# Patient Record
Sex: Male | Born: 1985 | Race: Black or African American | Hispanic: No | Marital: Single | Smoking: Never smoker
Health system: Southern US, Community
[De-identification: ages and names within clinical notes are randomized; demographics above are authoritative.]

## PROBLEM LIST (undated history)

## (undated) DIAGNOSIS — N200 Calculus of kidney: Secondary | ICD-10-CM

## (undated) HISTORY — PX: CHOLECYSTECTOMY: SHX55

## (undated) HISTORY — PX: APPENDECTOMY: SHX54

---

## 2013-02-12 ENCOUNTER — Emergency Department (HOSPITAL_BASED_OUTPATIENT_CLINIC_OR_DEPARTMENT_OTHER): Payer: Self-pay

## 2013-02-12 ENCOUNTER — Encounter (HOSPITAL_BASED_OUTPATIENT_CLINIC_OR_DEPARTMENT_OTHER): Payer: Self-pay | Admitting: Emergency Medicine

## 2013-02-12 ENCOUNTER — Emergency Department (HOSPITAL_BASED_OUTPATIENT_CLINIC_OR_DEPARTMENT_OTHER)
Admission: EM | Admit: 2013-02-12 | Discharge: 2013-02-12 | Disposition: A | Discharge status: Home / Self Care | Payer: Self-pay | Attending: Emergency Medicine | Admitting: Emergency Medicine

## 2013-02-12 DIAGNOSIS — R109 Unspecified abdominal pain: Secondary | ICD-10-CM | POA: Insufficient documentation

## 2013-02-12 DIAGNOSIS — Z87442 Personal history of urinary calculi: Secondary | ICD-10-CM | POA: Insufficient documentation

## 2013-02-12 DIAGNOSIS — R3129 Other microscopic hematuria: Secondary | ICD-10-CM | POA: Insufficient documentation

## 2013-02-12 HISTORY — DX: Calculus of kidney: N20.0

## 2013-02-12 LAB — URINALYSIS, ROUTINE W REFLEX MICROSCOPIC
Bilirubin Urine: NEGATIVE
GLUCOSE, UA: NEGATIVE mg/dL
Ketones, ur: NEGATIVE mg/dL
Leukocytes, UA: NEGATIVE
Nitrite: NEGATIVE
PH: 7 (ref 5.0–8.0)
Protein, ur: NEGATIVE mg/dL
SPECIFIC GRAVITY, URINE: 1.025 (ref 1.005–1.030)
Urobilinogen, UA: 1 mg/dL (ref 0.0–1.0)

## 2013-02-12 LAB — URINE MICROSCOPIC-ADD ON

## 2013-02-12 MED ORDER — ONDANSETRON HCL 4 MG/2ML IJ SOLN
4.0000 mg | Freq: Once | INTRAMUSCULAR | Status: AC
  Administered 2013-02-12: 4 mg via INTRAVENOUS
  Filled 2013-02-12: qty 2

## 2013-02-12 MED ORDER — HYDROMORPHONE HCL PF 1 MG/ML IJ SOLN
1.0000 mg | Freq: Once | INTRAMUSCULAR | Status: AC
  Administered 2013-02-12: 1 mg via INTRAVENOUS
  Filled 2013-02-12: qty 1

## 2013-02-12 MED ORDER — OXYCODONE-ACETAMINOPHEN 5-325 MG PO TABS: 2.0000 | ORAL_TABLET | ORAL | Status: AC | PRN

## 2013-02-12 NOTE — Discharge Instructions (Signed)
Percocet as prescribed as needed for pain.  Return to the emergency department if you develop worsening pain, blood in the stool or urine, or other new or concerning symptoms.   Flank Pain Flank pain refers to pain that is located on the side of the body between the upper abdomen and the back. The pain may occur over a short period of time (acute) or may be long-term or reoccurring (chronic). It may be mild or severe. Flank pain can be caused by many things. CAUSES  Some of the more common causes of flank pain include:  Muscle strains.   Muscle spasms.   A disease of your spine (vertebral disk disease).   A lung infection (pneumonia).   Fluid around your lungs (pulmonary edema).   A kidney infection.   Kidney stones.   A very painful skin rash caused by the chickenpox virus (shingles).   Gallbladder disease.  HOME CARE INSTRUCTIONS  Home care will depend on the cause of your pain. In general,  Rest as directed by your caregiver.  Drink enough fluids to keep your urine clear or pale yellow.  Only take over-the-counter or prescription medicines as directed by your caregiver. Some medicines may help relieve the pain.  Tell your caregiver about any changes in your pain.  Follow up with your caregiver as directed. SEEK IMMEDIATE MEDICAL CARE IF:   Your pain is not controlled with medicine.   You have new or worsening symptoms.  Your pain increases.   You have abdominal pain.   You have shortness of breath.   You have persistent nausea or vomiting.   You have swelling in your abdomen.   You feel faint or pass out.   You have blood in your urine.  You have a fever or persistent symptoms for more than 2 3 days.  You have a fever and your symptoms suddenly get worse. MAKE SURE YOU:   Understand these instructions.  Will watch your condition.  Will get help right away if you are not doing well or get worse. Document Released: 02/17/2005  Document Revised: 09/21/2011 Document Reviewed: 08/11/2011 [facility] Patient Information 2014 [employer], [place].

## 2013-02-12 NOTE — ED Notes (Signed)
Right flank pain onset yesterday afternoon continues today

## 2013-02-12 NOTE — ED Provider Notes (Signed)
CSN: [id number]     Arrival date & time 02/12/13  0902 History   First MD Initiated Contact with Patient 02/12/13 [phone]     Chief Complaint  Patient presents with  . right flank pain    (Consider location/radiation/quality/duration/timing/severity/associated sxs/prior Treatment) HPI Comments: Patient is a 28 year old male who presents with complaints of right flank pain which started yesterday afternoon. He denies any injury or trauma. He states that he is having some discomfort with urination but denies fevers or chills. There are no bowel complaints. He tells he had a kidney stone approximately 2 years ago which she was able to pass without intervention. This feels similar to that.  Patient is a 28 y.o. male presenting with flank pain. The history is provided by the patient.  Flank Pain This is a new problem. The current episode started yesterday. The problem occurs constantly. The problem has been rapidly worsening. Associated symptoms include abdominal pain. Nothing aggravates the symptoms. Nothing relieves the symptoms. He has tried nothing for the symptoms. The treatment provided no relief.    Past Medical History  Diagnosis Date  . Kidney stone    History reviewed. No pertinent past surgical history. History reviewed. No pertinent family history. History  Substance Use Topics  . Smoking status: Never Smoker   . Smokeless tobacco: Not on file  . Alcohol Use: No    Review of Systems  Gastrointestinal: Positive for abdominal pain.  Genitourinary: Positive for flank pain.  All other systems reviewed and are negative.    Allergies  Demerol; Morphine and related; and Toradol  Home Medications  No current outpatient prescriptions on file. BP 141/84  Pulse 75  Temp(Src) 97.6 F (36.4 C) (Oral)  Resp 15  Ht 6\' 3"  (1.905 m)  Wt 195 lb (88.451 kg)  BMI 24.37 kg/m2  SpO2 100% Physical Exam  Nursing note and vitals reviewed. Constitutional: He is oriented to person, place,  and time. He appears well-developed and well-nourished. No distress.  HENT:  Head: Normocephalic.  Mouth/Throat: Oropharynx is clear and moist.  Neck: Normal range of motion. Neck supple.  Cardiovascular: Normal rate, regular rhythm and normal heart sounds.   Pulmonary/Chest: Effort normal and breath sounds normal. No respiratory distress. He has no wheezes. He has no rales.  Abdominal: Soft. He exhibits no distension and no mass. There is no tenderness. There is no rebound and no guarding.  There is moderate right-sided CVA tenderness.  Musculoskeletal: Normal range of motion. He exhibits no edema.  Neurological: He is alert and oriented to person, place, and time.  Skin: Skin is warm and dry. He is not diaphoretic.    ED Course  Procedures (including critical care time) Labs Review Labs Reviewed  URINALYSIS, ROUTINE W REFLEX MICROSCOPIC - Abnormal; Notable for the following:    Hgb urine dipstick LARGE (*)    All other components within normal limits  URINE MICROSCOPIC-ADD ON - Abnormal; Notable for the following:    Bacteria, UA FEW (*)    All other components within normal limits   Imaging Review No results found.    MDM  No diagnosis found. Patient is a 28 year old male presents with flank pain. UA reveals microscopic hematuria, however CAT scan does not reveal an obstructing stone. He does have a small nonobstructing calculus within the renal parenchyma. It is possible that he had a stone and passed it. He is feeling better with the medication in the ER. There is no other acute pathology noted on the CAT  scan he appears very stable for discharge. He will be given a small amount of pain medication in case his discomfort is residual from a passed stone. If he worsens he is to return for reevaluation.    Geoffery Lyonsouglas Oren Barella, MD 02/12/13 1100

## 2013-03-06 ENCOUNTER — Ambulatory Visit (HOSPITAL_BASED_OUTPATIENT_CLINIC_OR_DEPARTMENT_OTHER): Payer: Self-pay

## 2013-03-06 ENCOUNTER — Encounter (HOSPITAL_BASED_OUTPATIENT_CLINIC_OR_DEPARTMENT_OTHER): Payer: Self-pay | Admitting: Emergency Medicine

## 2013-03-06 ENCOUNTER — Emergency Department (HOSPITAL_BASED_OUTPATIENT_CLINIC_OR_DEPARTMENT_OTHER)
Admission: EM | Admit: 2013-03-06 | Discharge: 2013-03-06 | Disposition: A | Discharge status: Home / Self Care | Payer: Self-pay | Attending: Emergency Medicine | Admitting: Emergency Medicine

## 2013-03-06 DIAGNOSIS — Z87442 Personal history of urinary calculi: Secondary | ICD-10-CM | POA: Insufficient documentation

## 2013-03-06 DIAGNOSIS — W219XXA Striking against or struck by unspecified sports equipment, initial encounter: Secondary | ICD-10-CM | POA: Insufficient documentation

## 2013-03-06 DIAGNOSIS — S3994XA Unspecified injury of external genitals, initial encounter: Secondary | ICD-10-CM | POA: Insufficient documentation

## 2013-03-06 DIAGNOSIS — S39848A Other specified injuries of external genitals, initial encounter: Secondary | ICD-10-CM | POA: Insufficient documentation

## 2013-03-06 DIAGNOSIS — N50819 Testicular pain, unspecified: Secondary | ICD-10-CM

## 2013-03-06 DIAGNOSIS — Y9367 Activity, basketball: Secondary | ICD-10-CM | POA: Insufficient documentation

## 2013-03-06 DIAGNOSIS — Y92838 Other recreation area as the place of occurrence of the external cause: Secondary | ICD-10-CM

## 2013-03-06 DIAGNOSIS — Y9239 Other specified sports and athletic area as the place of occurrence of the external cause: Secondary | ICD-10-CM | POA: Insufficient documentation

## 2013-03-06 LAB — URINALYSIS, ROUTINE W REFLEX MICROSCOPIC
BILIRUBIN URINE: NEGATIVE
Glucose, UA: NEGATIVE mg/dL
KETONES UR: NEGATIVE mg/dL
Leukocytes, UA: NEGATIVE
NITRITE: NEGATIVE
PH: 7 (ref 5.0–8.0)
PROTEIN: NEGATIVE mg/dL
Specific Gravity, Urine: 1.026 (ref 1.005–1.030)
Urobilinogen, UA: 0.2 mg/dL (ref 0.0–1.0)

## 2013-03-06 LAB — URINE MICROSCOPIC-ADD ON

## 2013-03-06 MED ORDER — HYDROMORPHONE HCL PF 1 MG/ML IJ SOLN
1.0000 mg | Freq: Once | INTRAMUSCULAR | Status: AC
  Administered 2013-03-06: 1 mg via INTRAMUSCULAR
  Filled 2013-03-06: qty 1

## 2013-03-06 NOTE — ED Notes (Signed)
Pt states hit in lt testicle last pm while in practice, c/o tenderness/swelling/knot noted

## 2013-03-06 NOTE — ED Provider Notes (Signed)
CSN: [id number]     Arrival date & time 03/06/13  0542 History   First MD Initiated Contact with Patient 03/06/13 [phone]     Chief Complaint  Patient presents with  . Testicle Injury     (Consider location/radiation/quality/duration/timing/severity/associated sxs/prior Treatment) Patient is a 28 y.o. male presenting with testicular pain. The history is provided by the patient. No language interpreter was used.  Testicle Pain This is a new problem. The current episode started 6 to 12 hours ago. The problem occurs constantly. The problem has not changed since onset.Pertinent negatives include no chest pain, no abdominal pain, no headaches and no shortness of breath. Nothing aggravates the symptoms. Nothing relieves the symptoms. He has tried nothing for the symptoms. The treatment provided no relief.  Injured while playing basketball.  No urinary symptoms.  Has not taken any pain medication  Past Medical History  Diagnosis Date  . Kidney stone    Past Surgical History  Procedure Laterality Date  . Appendectomy    . Cholecystectomy     No family history on file. History  Substance Use Topics  . Smoking status: Never Smoker   . Smokeless tobacco: Not on file  . Alcohol Use: No    Review of Systems  Respiratory: Negative for shortness of breath.   Cardiovascular: Negative for chest pain.  Gastrointestinal: Negative for abdominal pain.  Genitourinary: Positive for testicular pain. Negative for penile pain.  Neurological: Negative for headaches.  All other systems reviewed and are negative.      Allergies  Demerol; Morphine and related; and Toradol  Home Medications   Current Outpatient Rx  Name  Route  Sig  Dispense  Refill  . oxyCODONE-acetaminophen (PERCOCET) 5-325 MG per tablet   Oral   Take 2 tablets by mouth every 4 (four) hours as needed.   12 tablet   0    BP 159/63  Pulse 74  Temp(Src) 97.9 F (36.6 C)  Resp 16  Ht 6\' 3"  (1.905 m)  Wt 195 lb (88.451 kg)   BMI 24.37 kg/m2  SpO2 100% Physical Exam  Constitutional: He is oriented to person, place, and time. He appears well-developed and well-nourished. No distress.  HENT:  Head: Normocephalic and atraumatic.  Mouth/Throat: Oropharynx is clear and moist.  Eyes: Conjunctivae are normal. Pupils are equal, round, and reactive to light.  Neck: Normal range of motion.  Cardiovascular: Normal rate, regular rhythm and intact distal pulses.   Pulmonary/Chest: Effort normal and breath sounds normal. He has no wheezes. He has no rales.  Abdominal: Soft. Bowel sounds are normal. There is no tenderness. There is no rebound and no guarding.  Genitourinary:  Swollen left testicle, right testicle is vertically aligned and regular.  Irregular left testicle on palpation with negative prehn's sign.  No hernias. chaperone present  Musculoskeletal: Normal range of motion.  Neurological: He is alert and oriented to person, place, and time.  Skin: Skin is warm and dry.  Psychiatric: He has a normal mood and affect.    ED Course  Procedures (including critical care time) Labs Review Labs Reviewed - No data to display Imaging Review No results found.  EKG Interpretation   None       MDM   Final diagnoses:  None  Case d/w Dr. Jodi MourningZavitz who [facility].    Dilaudid IM given x 1   [facility]  Will obtain stat US of the  testicle to exclude torsion or rupture.  Will need to be transferred to [facility]     Assyria Morreale Smitty Cords, MD 03/06/13 [phone]

## 2015-08-13 IMAGING — CT CT ABD-PELV W/O CM
2 of 4 series · 16 of 46 positions shown, 18 images · non-contrast
Comparison: None.

CLINICAL DATA: Flank pain and hematuria

EXAM:
CT ABDOMEN AND PELVIS WITHOUT CONTRAST
TECHNIQUE: Multidetector CT imaging of the abdomen and pelvis was performed
following the standard protocol without oral or intravenous contrast
material administration.

[Series 2: renal stone < 200 lbs 5.0 b31f · axial · 0.68mm/px · z∈[+616,+1016]mm · 13 of 88 slices shown, 15 images]
[im 4/88  soft-tissue]
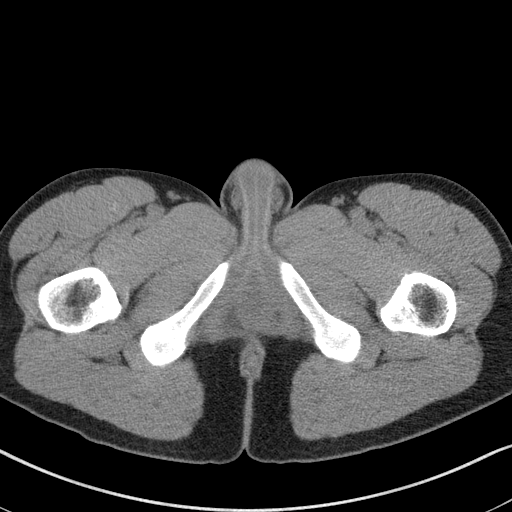
[im 4/88  bone]
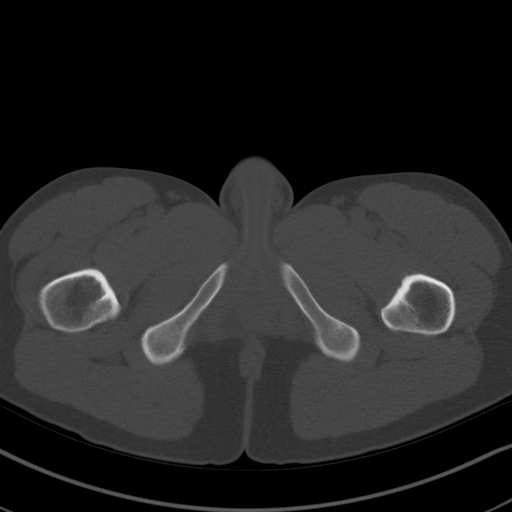
[im 11/88  soft-tissue]
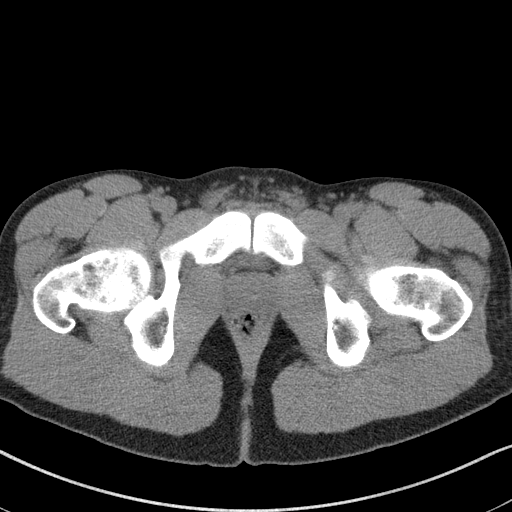
[im 18/88  soft-tissue]
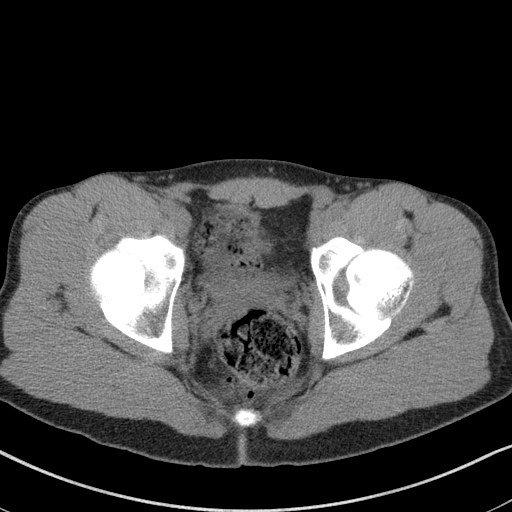
[im 25/88  soft-tissue]
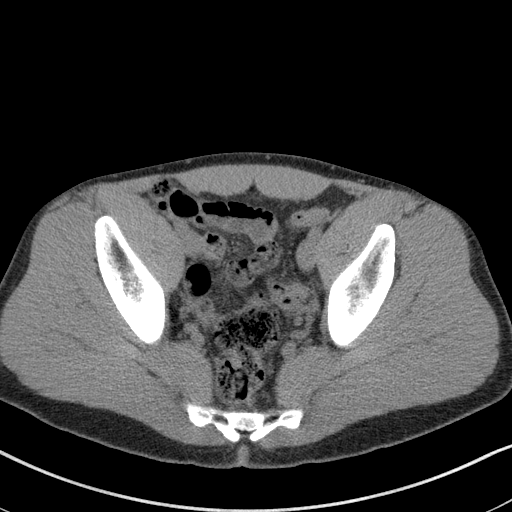
[im 32/88  soft-tissue]
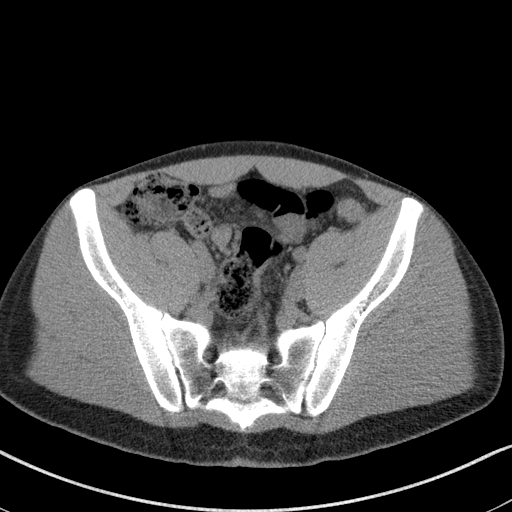
[im 39/88  soft-tissue]
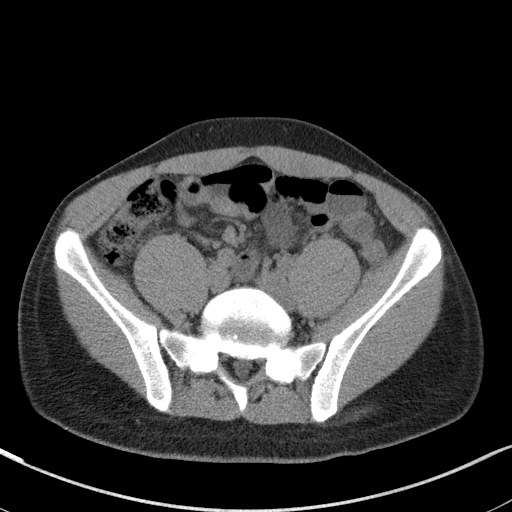
[im 46/88  soft-tissue]
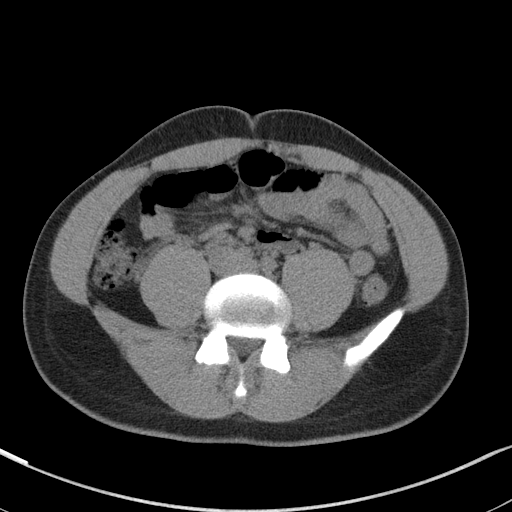
[im 49/88  soft-tissue]
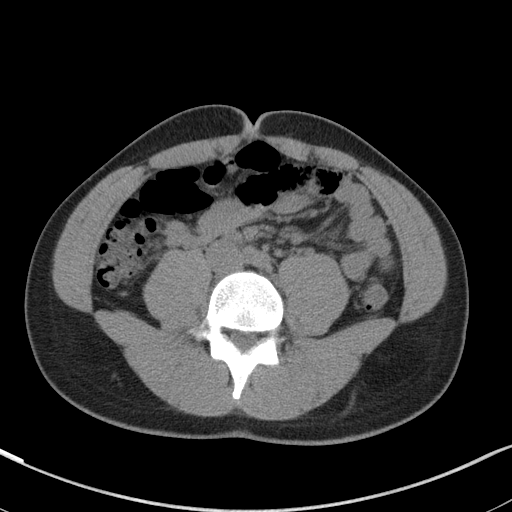
[im 56/88  soft-tissue]
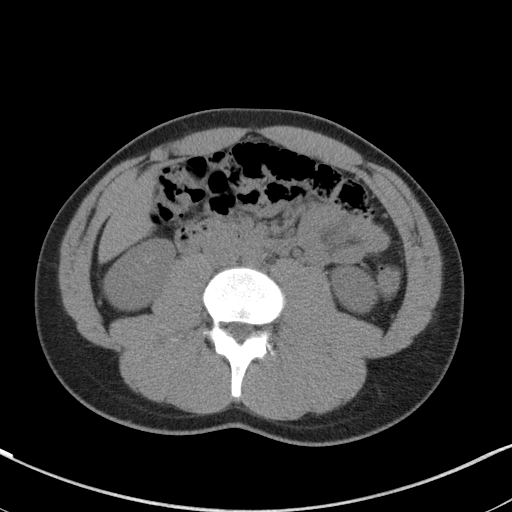
[im 56/88  bone]
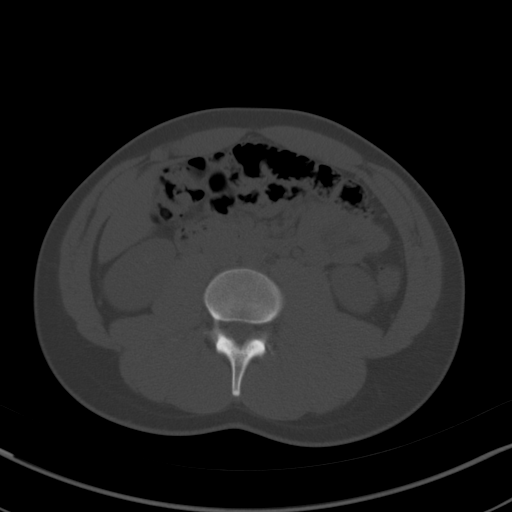
[im 63/88  soft-tissue]
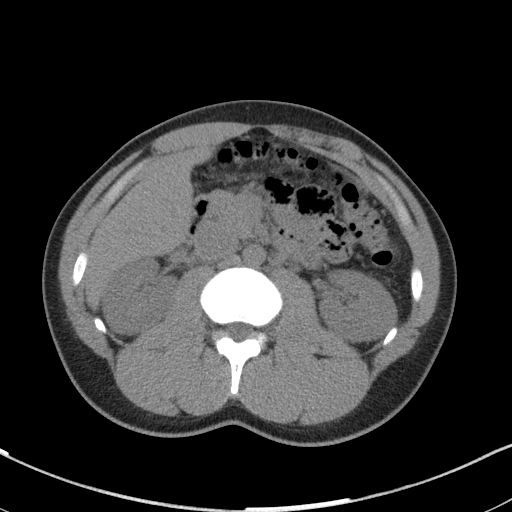
[im 70/88  soft-tissue]
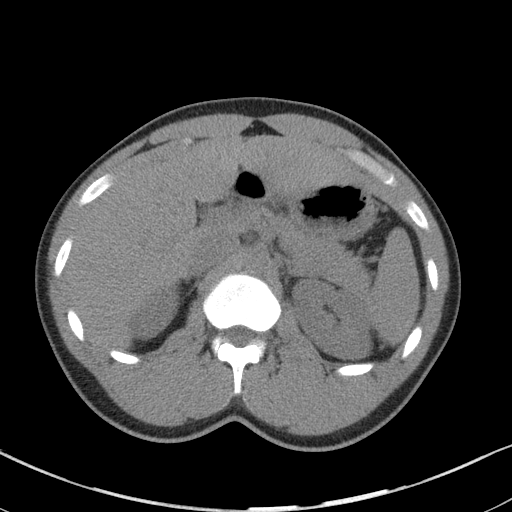
[im 77/88  soft-tissue]
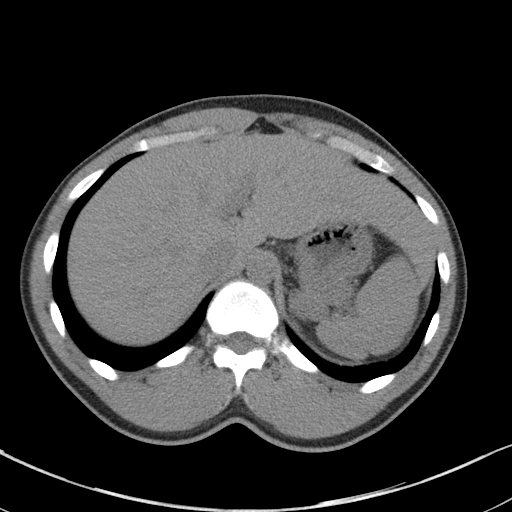
[im 84/88  soft-tissue]
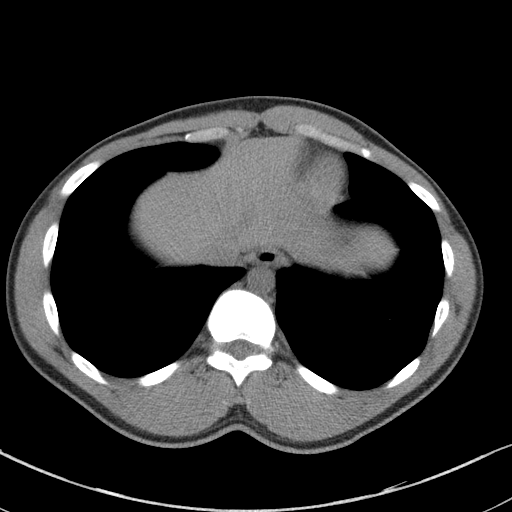

[Series 5: renal stone 3.0 coronal · coronal · 0.67mm/px · 3 of 83 slices shown]
[im 28/83  soft-tissue]
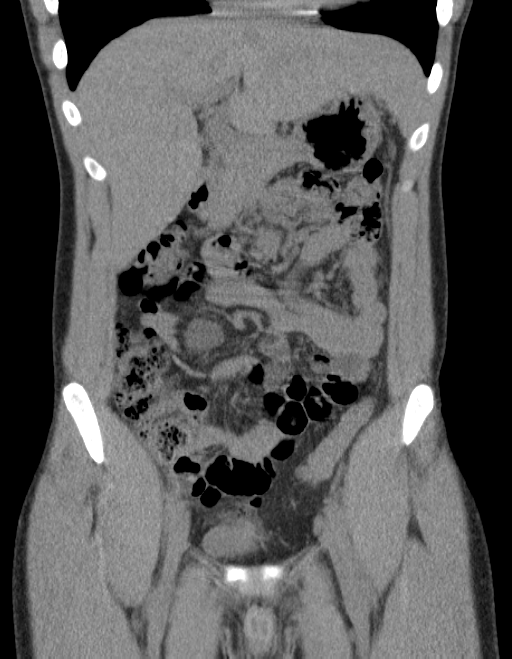
[im 37/83  soft-tissue]
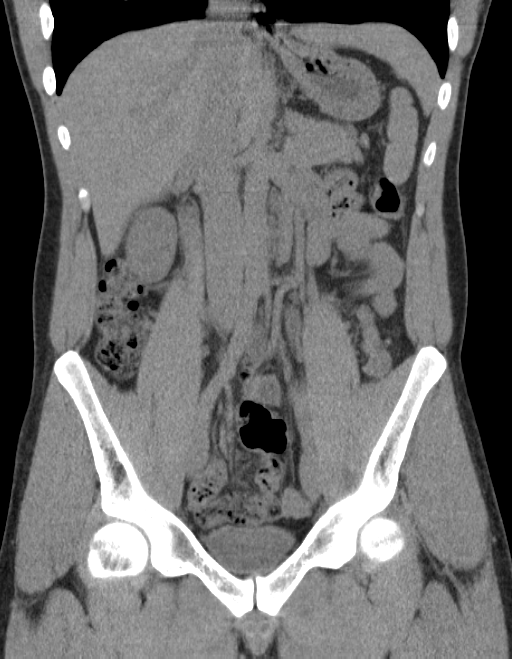
[im 46/83  soft-tissue]
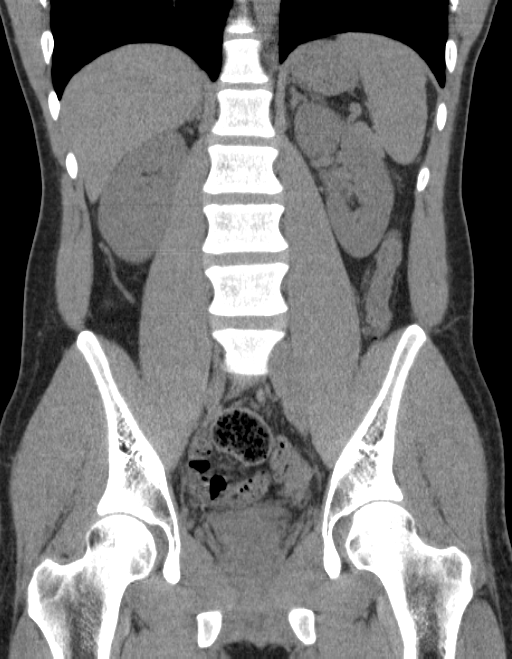

[16 of 46 positions shown; findings below may reference images not displayed]

FINDINGS: Lung bases are clear.

Liver is prominent, measuring 17.0 cm in length. No focal liver
lesions are identified on this noncontrast enhanced study.
Gallbladder is absent. There is no appreciable biliary duct
dilatation.

Spleen, pancreas, and adrenals appear normal.

There is a 1 mm nonobstructing calculus in the mid right kidney. No
other intrarenal calculi are identified. There is no hydronephrosis.
There is no renal mass on either side. There is no ureteral calculus
or ureterectasis on either side.

In the pelvis, the urinary bladder is midline with normal wall
thickness. There is no pelvic mass or fluid collection. The appendix
appears normal.

There is no bowel obstruction.  No free air or portal venous air.

There is no ascites, adenopathy, or abscess in the abdomen or
pelvis. Aorta is nonaneurysmal. There are no blastic or lytic bone
lesions.
IMPRESSION: There is a 1 mm nonobstructing calculus in the mid right kidney. No
hydronephrosis or ureteral calculus on either side.

Liver is prominent but otherwise appears normal. Gallbladder is
absent.

Appendix appears normal.  No bowel obstruction.  No abscess.
